# Patient Record
Sex: Male | Born: 2000 | Race: Asian | Hispanic: No | Marital: Single | Smoking: Never smoker
Health system: Southern US, Community
[De-identification: ages and names within clinical notes are randomized; demographics above are authoritative.]

## PROBLEM LIST (undated history)

## (undated) DIAGNOSIS — T7840XA Allergy, unspecified, initial encounter: Secondary | ICD-10-CM

## (undated) DIAGNOSIS — J020 Streptococcal pharyngitis: Secondary | ICD-10-CM

## (undated) HISTORY — PX: NO PAST SURGERIES: SHX2092

## (undated) HISTORY — DX: Allergy, unspecified, initial encounter: T78.40XA

---
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
## 2019-10-23 ENCOUNTER — Emergency Department (HOSPITAL_COMMUNITY)
Admission: EM | Admit: 2019-10-23 | Discharge: 2019-10-23 | Disposition: A | Discharge status: Home / Self Care | Payer: BC Managed Care – PPO | Attending: Emergency Medicine | Admitting: Emergency Medicine

## 2019-10-23 ENCOUNTER — Other Ambulatory Visit: Payer: Self-pay

## 2019-10-23 ENCOUNTER — Encounter (HOSPITAL_COMMUNITY): Payer: Self-pay | Admitting: Emergency Medicine

## 2019-10-23 DIAGNOSIS — X58XXXA Exposure to other specified factors, initial encounter: Secondary | ICD-10-CM | POA: Insufficient documentation

## 2019-10-23 DIAGNOSIS — Y939 Activity, unspecified: Secondary | ICD-10-CM | POA: Diagnosis not present

## 2019-10-23 DIAGNOSIS — J302 Other seasonal allergic rhinitis: Secondary | ICD-10-CM | POA: Diagnosis not present

## 2019-10-23 DIAGNOSIS — H9202 Otalgia, left ear: Secondary | ICD-10-CM | POA: Diagnosis present

## 2019-10-23 DIAGNOSIS — Y929 Unspecified place or not applicable: Secondary | ICD-10-CM | POA: Diagnosis not present

## 2019-10-23 DIAGNOSIS — Y999 Unspecified external cause status: Secondary | ICD-10-CM | POA: Diagnosis not present

## 2019-10-23 DIAGNOSIS — S00412A Abrasion of left ear, initial encounter: Secondary | ICD-10-CM | POA: Insufficient documentation

## 2019-10-23 MED ORDER — FLUTICASONE PROPIONATE 50 MCG/ACT NA SUSP: 2.0000 | Freq: Every day | NASAL | 0 refills | Status: DC

## 2019-10-23 MED ORDER — CETIRIZINE HCL 10 MG PO TABS: 10.0000 mg | ORAL_TABLET | Freq: Every day | ORAL | 0 refills | Status: DC

## 2019-10-23 MED ORDER — NEOMYCIN-POLYMYXIN-HC 3.5-10000-1 OT SUSP: 4.0000 [drp] | Freq: Three times a day (TID) | OTIC | 0 refills | Status: AC

## 2019-10-23 NOTE — ED Notes (Signed)
Pt discharged from ED; instructions provided and scripts given; Pt encouraged to return to ED if symptoms worsen and to f/u with PCP; Pt verbalized understanding of all instructions 

## 2019-10-23 NOTE — Discharge Instructions (Addendum)
1. Medications: Zyrtec, Flonase, polymyxin, usual home medications 2. Treatment: rest, drink plenty of fluids, do not stick sharp things in your ears 3. Follow Up: Please followup with your primary doctor in 3 days for discussion of your diagnoses and further evaluation after today's visit; if you do not have a primary care doctor use the resource guide provided to find one; Please return to the ER for fever, chills, changes in hearing, changes in vision or other concerns.

## 2019-10-23 NOTE — ED Provider Notes (Addendum)
[facility] EMERGENCY DEPARTMENT Provider Note   CSN: [id number] Arrival date & time: 10/23/19  2115     History   Chief Complaint Chief Complaint  Patient presents with  . Ear Fullness    HPI Cody Roy is a 18 y.o. male with a hx of seasonal allergies presents to the Emergency Department complaining of gradual, persistent, progressively worsening fullness in his right ear onset 2 days ago with fullness in the left ear starting 1 day ago.  Pt reports associated nasal congestion and rhinorrhea.  Pt reports hx of same, but is not currently taking any medication.  No treatments PTA.  Pt denies fever, chills, difficulty swallowing, difficulty breathing, cough.  Pt reports he thought his ears were full of wax and attempted to dig the wax out. Afterwards he noticed some bleeding from the left canal.      The history is provided by the patient and medical records. No language interpreter was used.    Past Medical History:  Diagnosis Date  . Allergy     There are no active problems to display for this patient.   History reviewed. No pertinent surgical history.      Home Medications    Prior to Admission medications   Medication Sig Start Date End Date Taking? Authorizing Provider  cetirizine (ZYRTEC ALLERGY) 10 MG tablet Take 1 tablet (10 mg total) by mouth daily. 10/23/19   Lissa Rowles, Dahlia Client, PA-C  fluticasone (FLONASE) 50 MCG/ACT nasal spray Place 2 sprays into both nostrils daily. 10/23/19   Asmara Backs, Dahlia Client, PA-C  neomycin-polymyxin-hydrocortisone (CORTISPORIN) 3.5-10000-1 OTIC suspension Place 4 drops into the left ear 3 (three) times daily for 3 days. 10/23/19 10/26/19  Simcha Speir, Dahlia Client, PA-C  penicillin v potassium (VEETID) 500 MG tablet Take 1 tablet (500 mg total) by mouth 3 (three) times daily. 08/30/14   Valarie Cones, Dema Severin, PA-C    Family History No family history on file.  Social History Social History   Tobacco Use  . Smoking status:  Never Smoker  Substance Use Topics  . Alcohol use: No  . Drug use: No     Allergies   Patient has no known allergies.   Review of Systems Review of Systems  Constitutional: Negative for fever.  HENT: Positive for congestion, ear discharge (left ear), postnasal drip and rhinorrhea. Negative for ear pain, facial swelling, nosebleeds, sore throat, tinnitus, trouble swallowing and voice change.        Ear fullness  Eyes: Negative for visual disturbance.  Respiratory: Negative for cough.   Skin: Negative for rash.     Physical Exam Updated Vital Signs BP (!) 135/99 (BP Location: Right Arm)   Pulse (!) 110   Temp 98.4 F (36.9 C) (Oral)   Resp 20   SpO2 98%   Physical Exam Vitals signs and nursing note reviewed.  Constitutional:      General: He is not in acute distress.    Appearance: He is well-developed.  HENT:     Head: Normocephalic.     Jaw: There is normal jaw occlusion. No trismus.     Right Ear: Hearing, ear canal and external ear normal. A middle ear effusion ( clear) is present. Tympanic membrane is not injected, perforated, erythematous, retracted or bulging.     Left Ear: Hearing and external ear normal. A middle ear effusion ( clear) is present. Tympanic membrane is not injected, perforated, erythematous, retracted or bulging.     Ears:     Comments: Abrasion  noted to the left canal with dried blood. No TM perforation.      Nose: Congestion and rhinorrhea present.     Mouth/Throat:     Mouth: Mucous membranes are moist.     Pharynx: Oropharynx is clear.  Eyes:     General: No scleral icterus.    Conjunctiva/sclera: Conjunctivae normal.  Neck:     Musculoskeletal: Normal range of motion.  Cardiovascular:     Rate and Rhythm: Normal rate.  Pulmonary:     Effort: Pulmonary effort is normal.  Musculoskeletal: Normal range of motion.  Skin:    General: Skin is warm and dry.  Neurological:     Mental Status: He is alert.      ED Treatments / Results    Procedures Procedures (including critical care time)  Medications Ordered in ED Medications - No data to display   Initial Impression / Assessment and Plan / ED Course  I have reviewed the triage vital signs and the nursing notes.  Pertinent labs & imaging results that were available during my care of the patient were reviewed by me and considered in my medical decision making (see chart for details).  Clinical Course as of Oct 22 2352  Sat Oct 23, 2019  2315 Pt with tachycardia at triage but no tachycardia on exam.   Pulse Rate: 68 [HM]    Clinical Course User Index [HM] Meaghen Vecchiarelli, Jarrett Soho, PA-C       Pt with small, clear bilateral ear effusion.  No evidence of otitis media.  No rupture of tympanic membrane.  Suspect allergic rhinitis causing clear ear effusion.  Will start patient on Zyrtec and give Flonase.  Left ear canal with bleeding abrasion from attempted cerumen removal.  Will give polymyxin drops for early otitis externa.  Discussed reasons to return immediately to the emergency department for further evaluation.  Final Clinical Impressions(s) / ED Diagnoses   Final diagnoses:  Seasonal allergic rhinitis, unspecified trigger  Abrasion of left ear canal, initial encounter    ED Discharge Orders         Ordered    cetirizine (ZYRTEC ALLERGY) 10 MG tablet  Daily     10/23/19 2318    fluticasone (FLONASE) 50 MCG/ACT nasal spray  Daily     10/23/19 2318    neomycin-polymyxin-hydrocortisone (CORTISPORIN) 3.5-10000-1 OTIC suspension  3 times daily     10/23/19 2318           Bitania Shankland, Gwenlyn Perking 10/23/19 2319    Keiandre Cygan, Jarrett Soho, PA-C 10/23/19 2354    Isla Pence, MD 10/24/19 1501

## 2019-10-23 NOTE — ED Triage Notes (Signed)
Pt here for eval of ear pain to both ears, right ear feels worse. Pt states he feels like there is water in his ears X 2 days. Some sneezing in the morning.

## 2019-11-06 ENCOUNTER — Other Ambulatory Visit: Payer: Self-pay

## 2019-11-06 ENCOUNTER — Encounter (HOSPITAL_COMMUNITY): Payer: Self-pay | Admitting: Emergency Medicine

## 2019-11-06 ENCOUNTER — Emergency Department (HOSPITAL_COMMUNITY)
Admission: EM | Admit: 2019-11-06 | Discharge: 2019-11-06 | Disposition: A | Discharge status: Home / Self Care | Payer: BC Managed Care – PPO | Attending: Emergency Medicine | Admitting: Emergency Medicine

## 2019-11-06 ENCOUNTER — Emergency Department (HOSPITAL_COMMUNITY): Payer: BC Managed Care – PPO

## 2019-11-06 DIAGNOSIS — Z79899 Other long term (current) drug therapy: Secondary | ICD-10-CM | POA: Insufficient documentation

## 2019-11-06 DIAGNOSIS — Y999 Unspecified external cause status: Secondary | ICD-10-CM | POA: Diagnosis not present

## 2019-11-06 DIAGNOSIS — M25511 Pain in right shoulder: Secondary | ICD-10-CM | POA: Diagnosis not present

## 2019-11-06 DIAGNOSIS — Y92410 Unspecified street and highway as the place of occurrence of the external cause: Secondary | ICD-10-CM | POA: Insufficient documentation

## 2019-11-06 DIAGNOSIS — Y9389 Activity, other specified: Secondary | ICD-10-CM | POA: Insufficient documentation

## 2019-11-06 MED ORDER — CYCLOBENZAPRINE HCL 10 MG PO TABS: 10.0000 mg | ORAL_TABLET | Freq: Two times a day (BID) | ORAL | 0 refills | Status: DC | PRN

## 2019-11-06 MED ORDER — IBUPROFEN 800 MG PO TABS: 800.0000 mg | ORAL_TABLET | Freq: Three times a day (TID) | ORAL | 0 refills | Status: DC

## 2019-11-06 MED ORDER — IBUPROFEN 400 MG PO TABS
800.0000 mg | ORAL_TABLET | Freq: Once | ORAL | Status: AC
  Administered 2019-11-06: 03:00:00 800 mg via ORAL
  Filled 2019-11-06: qty 2
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
## 2019-11-06 NOTE — ED Notes (Signed)
ED Provider at bedside. 
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
## 2020-09-19 ENCOUNTER — Emergency Department (HOSPITAL_COMMUNITY)
Admission: EM | Admit: 2020-09-19 | Discharge: 2020-09-20 | Disposition: A | Discharge status: Home / Self Care | Payer: BC Managed Care – PPO | Attending: Emergency Medicine | Admitting: Emergency Medicine

## 2020-09-19 ENCOUNTER — Emergency Department (HOSPITAL_COMMUNITY): Payer: BC Managed Care – PPO

## 2020-09-19 ENCOUNTER — Encounter (HOSPITAL_COMMUNITY): Payer: Self-pay | Admitting: Emergency Medicine

## 2020-09-19 ENCOUNTER — Other Ambulatory Visit: Payer: Self-pay

## 2020-09-19 DIAGNOSIS — J069 Acute upper respiratory infection, unspecified: Secondary | ICD-10-CM | POA: Insufficient documentation

## 2020-09-19 DIAGNOSIS — Z20822 Contact with and (suspected) exposure to covid-19: Secondary | ICD-10-CM | POA: Diagnosis not present

## 2020-09-19 DIAGNOSIS — R0981 Nasal congestion: Secondary | ICD-10-CM | POA: Diagnosis present

## 2020-09-19 LAB — CBC
HCT: 48.3 % (ref 39.0–52.0)
Hemoglobin: 15.7 g/dL (ref 13.0–17.0)
MCH: 26.7 pg (ref 26.0–34.0)
MCHC: 32.5 g/dL (ref 30.0–36.0)
MCV: 82.1 fL (ref 80.0–100.0)
Platelets: 282 10*3/uL (ref 150–400)
RBC: 5.88 MIL/uL — ABNORMAL HIGH (ref 4.22–5.81)
RDW: 12.6 % (ref 11.5–15.5)
WBC: 9.9 10*3/uL (ref 4.0–10.5)
nRBC: 0 % (ref 0.0–0.2)

## 2020-09-19 LAB — BASIC METABOLIC PANEL
Anion gap: 12 (ref 5–15)
BUN: 16 mg/dL (ref 6–20)
CO2: 22 mmol/L (ref 22–32)
Calcium: 9.5 mg/dL (ref 8.9–10.3)
Chloride: 103 mmol/L (ref 98–111)
Creatinine, Ser: 0.84 mg/dL (ref 0.61–1.24)
GFR calc non Af Amer: >60 mL/min (ref >60)
Glucose, Bld: 98 mg/dL (ref 70–99)
Potassium: 4.2 mmol/L (ref 3.5–5.1)
Sodium: 137 mmol/L (ref 135–145)

## 2020-09-19 LAB — RESPIRATORY PANEL BY RT PCR (FLU A&B, COVID)
Influenza A by PCR: NEGATIVE
Influenza B by PCR: NEGATIVE
SARS Coronavirus 2 by RT PCR: NEGATIVE

## 2020-09-19 MED ORDER — ALBUTEROL SULFATE (2.5 MG/3ML) 0.083% IN NEBU: 5.0000 mg | INHALATION_SOLUTION | Freq: Once | RESPIRATORY_TRACT | Status: DC

## 2020-09-19 NOTE — ED Triage Notes (Addendum)
Pt presents to ED POv. Pt c/o coughing, rhinorrhea, SOB, CP w/ resp. Pt reports he was exposed to COVID last week. Resp e/u. Pt is vaccinated

## 2020-09-20 MED ORDER — ALBUTEROL SULFATE HFA 108 (90 BASE) MCG/ACT IN AERS
2.0000 | INHALATION_SPRAY | Freq: Once | RESPIRATORY_TRACT | Status: AC
  Administered 2020-09-20: 2 via RESPIRATORY_TRACT
  Filled 2020-09-20: qty 6.7

## 2020-09-20 NOTE — ED Provider Notes (Signed)
[facility] EMERGENCY DEPARTMENT Provider Note   CSN: [id number] Arrival date & time: 09/19/20  2142     History Chief Complaint  Patient presents with  . Covid Exposure    Cody Roy is a 19 y.o. male.   19 year old male with hx of seasonal allergies presents to the emergency department for evaluation of URI symptoms.  Reports that he began with congestion and rhinorrhea 2 days ago.  Took Allegra without relief.  Has since developed a cough productive of phlegm.  Has some discomfort with breathing.  Reports subjective fever, but is unsure of his temperature at home.  Was exposed to COVID-19 last week via a coworker.  States that he is fully vaccinated against COVID-19.  Denies V/D, hemoptysis, syncope, leg swelling, recent surgeries or hospitalizations.  The history is provided by the patient. No language interpreter was used.       Past Medical History:  Diagnosis Date  . Allergy     There are no problems to display for this patient.   History reviewed. No pertinent surgical history.     History reviewed. No pertinent family history.  Social History   Tobacco Use  . Smoking status: Never Smoker  . Smokeless tobacco: Never Used  Substance Use Topics  . Alcohol use: No  . Drug use: No    Home Medications Prior to Admission medications   Medication Sig Start Date End Date Taking? Authorizing Provider  cetirizine (ZYRTEC ALLERGY) 10 MG tablet Take 1 tablet (10 mg total) by mouth daily. 10/23/19   Muthersbaugh, Dahlia Client, PA-C  cyclobenzaprine (FLEXERIL) 10 MG tablet Take 1 tablet (10 mg total) by mouth 2 (two) times daily as needed for muscle spasms. 11/06/19   Roxy Horseman, PA-C  fluticasone (FLONASE) 50 MCG/ACT nasal spray Place 2 sprays into both nostrils daily. Patient not taking: Reported on 09/20/2020 10/23/19   Muthersbaugh, Dahlia Client, PA-C  ibuprofen (ADVIL) 800 MG tablet Take 1 tablet (800 mg total) by mouth 3 (three) times daily. Patient  not taking: Reported on 09/20/2020 11/06/19   Roxy Horseman, PA-C  penicillin v potassium (VEETID) 500 MG tablet Take 1 tablet (500 mg total) by mouth 3 (three) times daily. Patient not taking: Reported on 09/20/2020 08/30/14   Morrell Riddle, PA-C    Allergies    Patient has no known allergies.  Review of Systems   Review of Systems  Ten systems reviewed and are negative for acute change, except as noted in the HPI.    Physical Exam Updated Vital Signs BP 123/87   Pulse (!) 59   Temp 98.8 F (37.1 C) (Oral)   Resp 18   SpO2 99%   Physical Exam Vitals and nursing note reviewed.  Constitutional:      General: He is not in acute distress.    Appearance: He is well-developed. He is not diaphoretic.     Comments: Nontoxic appearing and in NAD  HENT:     Head: Normocephalic and atraumatic.     Nose: Congestion present. No rhinorrhea.  Eyes:     General: No scleral icterus.    Conjunctiva/sclera: Conjunctivae normal.  Cardiovascular:     Rate and Rhythm: Normal rate and regular rhythm.     Pulses: Normal pulses.  Pulmonary:     Effort: Pulmonary effort is normal. No respiratory distress.     Breath sounds: No stridor. No wheezing or rales.     Comments: Respirations even and unlabored Musculoskeletal:  General: Normal range of motion.     Cervical back: Normal range of motion.  Skin:    General: Skin is warm and dry.     Coloration: Skin is not pale.     Findings: No erythema or rash.  Neurological:     Mental Status: He is alert and oriented to person, place, and time.     Coordination: Coordination normal.  Psychiatric:        Behavior: Behavior normal.     ED Results / Procedures / Treatments   Labs (all labs ordered are listed, but only abnormal results are displayed) Labs Reviewed  CBC - Abnormal; Notable for the following components:      Result Value   RBC 5.88 (*)    All other components within normal limits  RESPIRATORY PANEL BY RT PCR (FLU  A&B, COVID)  BASIC METABOLIC PANEL    EKG EKG Interpretation  Date/Time:  Tuesday September 19 2020 22:11:48 EDT Ventricular Rate:  101 PR Interval:  134 QRS Duration: 94 QT Interval:  344 QTC Calculation: 446 R Axis:   113 Text Interpretation: Sinus tachycardia Right axis deviation Cannot rule out Inferior infarct , age undetermined Abnormal ECG No previous tracing Confirmed by Gilda Crease [phone]) on 09/20/2020 5:48:34 AM   Radiology DG Chest Portable 1 View  Result Date: 09/19/2020 CLINICAL DATA:  Pt presents to ED POv. Pt c/o coughing, rhinorrhea, SOB, CP w/ resp. Pt reports he was exposed to COVID last week. Resp e/u. Pt is vaccinated EXAM: PORTABLE CHEST 1 VIEW COMPARISON:  Chest x-ray 12/08/2012 FINDINGS: The heart size and mediastinal contours are within normal limits. No definite focal consolidation. No pulmonary edema. No pleural effusion. No pneumothorax. No acute osseous abnormality. IMPRESSION: No active disease. Electronically Signed   By: Tish Frederickson M.D.   On: 09/19/2020 22:31    Procedures Procedures (including critical care time)  Medications Ordered in ED Medications  albuterol (VENTOLIN HFA) 108 (90 Base) MCG/ACT inhaler 2 puff (has no administration in time range)    ED Course  I have reviewed the triage vital signs and the nursing notes.  Pertinent labs & imaging results that were available during my care of the patient were reviewed by me and considered in my medical decision making (see chart for details).    MDM Rules/Calculators/A&P                          CXR negative for acute infiltrate. Patient's symptoms are consistent with URI, likely viral etiology. Discussed that antibiotics are not indicated for viral infections. Patient will be discharged with symptomatic treatment. He verbalizes understanding and is agreeable with plan. Patient is hemodynamically stable and in NAD prior to discharge.  Cody Roy was evaluated in Emergency  Department on 09/20/2020 for the symptoms described in the history of present illness. He was evaluated in the context of the global COVID-19 pandemic, which necessitated consideration that the patient might be at risk for infection with the SARS-CoV-2 virus that causes COVID-19. Institutional protocols and algorithms that pertain to the evaluation of patients at risk for COVID-19 are in a state of rapid change based on information released by regulatory bodies including the CDC and federal and state organizations. These policies and algorithms were followed during the patient's care in the ED.   Final Clinical Impression(s) / ED Diagnoses Final diagnoses:  Upper respiratory tract infection, unspecified type    Rx / DC Orders ED  Discharge Orders    None       Antony Madura, PA-C 09/20/20 [id number]    Gilda Crease, MD 09/22/20 [phone]

## 2020-09-20 NOTE — Discharge Instructions (Signed)
Your Covid test today was negative.  You are likely suffering from a different viral URI.  We recommend daily use of an allergy medication such as Zyrtec, Claritin, Allegra for management of congestion.  You may also use over-the-counter nasal sprays for relief.  Take Mucinex for cough and 1000 mg Tylenol every 8 hours for headache, body aches, fever.  You have been given an albuterol inhaler and may use 2 puffs every 4-6 hours for wheezing, chest tightness, shortness of breath.  Follow-up with your primary care doctor in 1 week for persistent symptoms.

## 2020-10-10 ENCOUNTER — Ambulatory Visit
Admission: EM | Admit: 2020-10-10 | Discharge: 2020-10-10 | Disposition: A | Discharge status: Home / Self Care | Payer: BC Managed Care – PPO

## 2020-10-10 DIAGNOSIS — H6592 Unspecified nonsuppurative otitis media, left ear: Secondary | ICD-10-CM

## 2020-10-10 MED ORDER — CETIRIZINE HCL 10 MG PO TABS: 10.0000 mg | ORAL_TABLET | Freq: Every day | ORAL | 0 refills | Status: DC

## 2020-10-10 MED ORDER — FLUTICASONE PROPIONATE 50 MCG/ACT NA SUSP: 1.0000 | Freq: Every day | NASAL | 2 refills | Status: AC

## 2020-10-10 NOTE — ED Triage Notes (Signed)
Pt c/o lt ear fullness, sharp pain, and warm to touch x3 days.

## 2020-10-10 NOTE — Discharge Instructions (Addendum)
Take Zyrtec and Flonase for middle ear effusion.

## 2020-10-10 NOTE — ED Provider Notes (Signed)
Emergency Department Provider Note  ____________________________________________  Time seen: Approximately 10:41 AM  I have reviewed the triage vital signs and the nursing notes.   HISTORY  Chief Complaint Otalgia   Historian Patient     HPI Cody Roy is a 19 y.o. male presents to the emergency department with aching left ear pain for the past 3 days. Patient denies discharge from the left ear or difficulty hearing. No recent episodes of swimming. Patient states he is not prone to otitis media and has not experienced similar symptoms recently. Does have a history of seasonal allergies. No fever or chills at home. No other alleviating measures have been attempted.   Past Medical History:  Diagnosis Date  . Allergy      Immunizations up to date:  Yes.     Past Medical History:  Diagnosis Date  . Allergy     There are no problems to display for this patient.   History reviewed. No pertinent surgical history.  Prior to Admission medications   Medication Sig Start Date End Date Taking? Authorizing Provider  fexofenadine (ALLEGRA) 60 MG tablet Take 60 mg by mouth daily.  10/10/20 Yes [provider]  cetirizine (ZYRTEC ALLERGY) 10 MG tablet Take 1 tablet (10 mg total) by mouth daily for 10 days. 10/10/20 10/20/20  Orvil Feil, PA-C  fluticasone (FLONASE) 50 MCG/ACT nasal spray Place 1 spray into both nostrils daily for 7 days. 10/10/20 10/17/20  Orvil Feil, PA-C    Allergies Patient has no known allergies.  No family history on file.  Social History Social History   Tobacco Use  . Smoking status: Never Smoker  . Smokeless tobacco: Never Used  Substance Use Topics  . Alcohol use: No  . Drug use: No     Review of Systems  Constitutional: No fever/chills Eyes:  No discharge ENT: Patient has left ear pain.  Respiratory: no cough. No SOB/ use of accessory muscles to breath Gastrointestinal:   No nausea, no vomiting.  No diarrhea.  No  constipation. Musculoskeletal: Negative for musculoskeletal pain. Skin: Negative for rash, abrasions, lacerations, ecchymosis.    ____________________________________________   PHYSICAL EXAM:  VITAL SIGNS: ED Triage Vitals  Enc Vitals Group     BP 10/10/20 1026 132/85     Pulse Rate 10/10/20 1026 70     Resp 10/10/20 1026 18     Temp 10/10/20 1026 (!) 97.5 F (36.4 C)     Temp Source 10/10/20 1026 Oral     SpO2 10/10/20 1026 97 %     Weight --      Height --      Head Circumference --      Peak Flow --      Pain Score 10/10/20 1027 8     Pain Loc --      Pain Edu? --      Excl. in GC? --      Constitutional: Alert and oriented. Well appearing and in no acute distress. Eyes: Conjunctivae are normal. PERRL. EOMI. Head: Atraumatic. ENT:      Ears: Left middle ear is effused. No evidence of purulent exudate behind left TM. No tenderness to palpation of the left tragus.      Nose: No congestion/rhinnorhea.      Mouth/Throat: Mucous membranes are moist.  Neck: No stridor.  No cervical spine tenderness to palpation. Cardiovascular: Normal rate, regular rhythm. Normal S1 and S2.  Good peripheral circulation. Respiratory: Normal respiratory effort without tachypnea or  retractions. Lungs CTAB. Good air entry to the bases with no decreased or absent breath sounds Skin:  Skin is warm, dry and intact. No rash noted. Psychiatric: Mood and affect are normal for age. Speech and behavior are normal.   ____________________________________________   LABS (all labs ordered are listed, but only abnormal results are displayed)  Labs Reviewed - No data to display ____________________________________________  EKG   ____________________________________________  RADIOLOGY  No results found.  ____________________________________________    PROCEDURES  Procedure(s) performed:     Procedures     Medications - No data to  display   ____________________________________________   INITIAL IMPRESSION / ASSESSMENT AND PLAN / ED COURSE  Pertinent labs & imaging results that were available during my care of the patient were reviewed by me and considered in my medical decision making (see chart for details).    Assessment and plan Middle ear effusion 19 year old male presents to the emergency department with left ear pain for the past 3 days. Physical exam findings suggest middle ear effusion. Recommended Flonase and Zyrtec. Return precautions were given to return with new or worsening symptoms.    ____________________________________________  FINAL CLINICAL IMPRESSION(S) / ED DIAGNOSES  Final diagnoses:  Fluid level behind tympanic membrane of left ear      NEW MEDICATIONS STARTED DURING THIS VISIT:  ED Discharge Orders         Ordered    fluticasone (FLONASE) 50 MCG/ACT nasal spray  Daily        10/10/20 1039    cetirizine (ZYRTEC ALLERGY) 10 MG tablet  Daily        10/10/20 1039              This chart was dictated using voice recognition software/Dragon. Despite best efforts to proofread, errors can occur which can change the meaning. Any change was purely unintentional.     Orvil Feil, PA-C 10/10/20 1043

## 2021-01-28 ENCOUNTER — Emergency Department (HOSPITAL_COMMUNITY): Payer: BC Managed Care – PPO

## 2021-01-28 ENCOUNTER — Emergency Department (HOSPITAL_COMMUNITY)
Admission: EM | Admit: 2021-01-28 | Discharge: 2021-01-28 | Disposition: A | Discharge status: Home / Self Care | Payer: BC Managed Care – PPO | Attending: Emergency Medicine | Admitting: Emergency Medicine

## 2021-01-28 ENCOUNTER — Other Ambulatory Visit: Payer: Self-pay

## 2021-01-28 ENCOUNTER — Encounter (HOSPITAL_COMMUNITY): Payer: Self-pay | Admitting: *Deleted

## 2021-01-28 DIAGNOSIS — R1110 Vomiting, unspecified: Secondary | ICD-10-CM | POA: Diagnosis not present

## 2021-01-28 DIAGNOSIS — R0602 Shortness of breath: Secondary | ICD-10-CM | POA: Diagnosis present

## 2021-01-28 DIAGNOSIS — R059 Cough, unspecified: Secondary | ICD-10-CM | POA: Insufficient documentation

## 2021-01-28 MED ORDER — ALBUTEROL SULFATE HFA 108 (90 BASE) MCG/ACT IN AERS
2.0000 | INHALATION_SPRAY | RESPIRATORY_TRACT | Status: DC | PRN
  Administered 2021-01-28: 2 via RESPIRATORY_TRACT
  Filled 2021-01-28: qty 6.7

## 2021-01-28 NOTE — ED Triage Notes (Signed)
Patient presents to ed c/o cough x 1 week, states this am he woke up coughing until he threw up, c/o chest pain with cough, now he feels like he is wheezing.

## 2021-01-28 NOTE — ED Provider Notes (Signed)
[facility] Emergency Department Provider Note MRN:  [id number]  Arrival date & time: 01/28/21     Chief Complaint   Shortness of Breath   History of Present Illness   Cody Roy is a 20 y.o. year-old male with no previous medical history presenting to the ED with chief complaint of shortness of breath.  Patient endorsing severe coughing fit this evening, persistently coughing, unable to catch breath, one episode of posttussive emesis.  Feeling completely better after inhaler treatment in triage, no coughing since.  Denies any shortness of breath, no chest pain, no leg pain or swelling, no other complaints.  Review of Systems  A complete 10 system review of systems was obtained and all systems are negative except as noted in the HPI and PMH.   Patient's Health History    Past Medical History:  Diagnosis Date  . Allergy     History reviewed. No pertinent surgical history.  No family history on file.  Social History   Socioeconomic History  . Marital status: Single    Spouse name: Not on file  . Number of children: Not on file  . Years of education: Not on file  . Highest education level: Not on file  Occupational History  . Not on file  Tobacco Use  . Smoking status: Never Smoker  . Smokeless tobacco: Never Used  Substance and Sexual Activity  . Alcohol use: No  . Drug use: No  . Sexual activity: Not on file  Other Topics Concern  . Not on file  Social History Narrative  . Not on file   Social Determinants of Health   Financial Resource Strain: Not on file  Food Insecurity: Not on file  Transportation Needs: Not on file  Physical Activity: Not on file  Stress: Not on file  Social Connections: Not on file  Intimate Partner Violence: Not on file     Physical Exam   Vitals:   01/28/21 0313  BP: (!) 135/108  Pulse: 91  Resp: 17  Temp: 98 F (36.7 C)  SpO2: 100%    CONSTITUTIONAL: Well-appearing, NAD NEURO:  Alert and  oriented x 3, no focal deficits EYES:  eyes equal and reactive ENT/NECK:  no LAD, no JVD CARDIO: Regular rate, well-perfused, normal S1 and S2 PULM:  CTAB no wheezing or rhonchi GI/GU:  normal bowel sounds, non-distended, non-tender MSK/SPINE:  No gross deformities, no edema SKIN:  no rash, atraumatic PSYCH:  Appropriate speech and behavior  *Additional and/or pertinent findings included in MDM below  Diagnostic and Interventional Summary    EKG Interpretation  Date/Time:    Ventricular Rate:    PR Interval:    QRS Duration:   QT Interval:    QTC Calculation:   R Axis:     Text Interpretation:        Labs Reviewed - No data to display  DG Chest 2 View  Final Result      Medications  albuterol (VENTOLIN HFA) 108 (90 Base) MCG/ACT inhaler 2 puff (2 puffs Inhalation Given 01/28/21 0328)     Procedures  /  Critical Care Procedures  ED Course and Medical Decision Making  I have reviewed the triage vital signs, the nursing notes, and pertinent available records from the EMR.  Listed above are laboratory and imaging tests that I personally ordered, reviewed, and interpreted and then considered in my medical decision making (see below for details).  Cough, suspect viral process, x-ray is normal.  No  leg pain or swelling, no shortness of breath, doubt PE, appropriate for discharge.       Elmer Sow. Pilar Plate, MD [facility] Emergency Medicine [facility] [email or web address]  Final Clinical Impressions(s) / ED Diagnoses     ICD-10-CM   1. Cough  R05.9     ED Discharge Orders    None       Discharge Instructions Discussed with and Provided to Patient:     Discharge Instructions     You were evaluated in the Emergency Department and after careful evaluation, we did not find any emergent condition requiring admission or further testing in the hospital.  Your exam/testing today was overall reassuring.  Use the inhaler as needed for coughing  fits.  Please return to the Emergency Department if you experience any worsening of your condition.  Thank you for allowing [place] to be a part of your care.       Sabas Sous, MD 01/28/21 [phone]

## 2021-01-28 NOTE — Discharge Instructions (Addendum)
You were evaluated in the Emergency Department and after careful evaluation, we did not find any emergent condition requiring admission or further testing in the hospital.  Your exam/testing today was overall reassuring.  Use the inhaler as needed for coughing fits.  Please return to the Emergency Department if you experience any worsening of your condition.  Thank you for allowing [place] to be a part of your care.

## 2021-02-26 ENCOUNTER — Encounter: Payer: Self-pay | Admitting: Emergency Medicine

## 2021-02-26 ENCOUNTER — Ambulatory Visit
Admission: EM | Admit: 2021-02-26 | Discharge: 2021-02-26 | Disposition: A | Discharge status: Home / Self Care | Payer: BC Managed Care – PPO

## 2021-02-26 ENCOUNTER — Other Ambulatory Visit: Payer: Self-pay

## 2021-02-26 DIAGNOSIS — J22 Unspecified acute lower respiratory infection: Secondary | ICD-10-CM | POA: Diagnosis not present

## 2021-02-26 MED ORDER — DOXYCYCLINE HYCLATE 100 MG PO CAPS: 100.0000 mg | ORAL_CAPSULE | Freq: Two times a day (BID) | ORAL | 0 refills | Status: AC

## 2021-02-26 MED ORDER — CETIRIZINE HCL 10 MG PO CAPS: 10.0000 mg | ORAL_CAPSULE | Freq: Every day | ORAL | 0 refills | Status: AC

## 2021-02-26 MED ORDER — BENZONATATE 100 MG PO CAPS: 100.0000 mg | ORAL_CAPSULE | Freq: Three times a day (TID) | ORAL | 0 refills | Status: AC

## 2021-02-26 NOTE — ED Provider Notes (Signed)
[facility]    CSN: [id number] Arrival date & time: 02/26/21  0951      History   Chief Complaint Chief Complaint  Patient presents with  . Cough    HPI Cody Roy is a 20 y.o. male presenting today for evaluation of a cough.  Reports that he has had a cough for approximately 1 month.  Seen in date ED 2 weeks ago and given an inhaler.  Chest x-ray negative.  Reports continued cough, occasional wheezing, postnasal drainage and throat irritation.  Denies any fevers body aches chills or fatigue  HPI  Past Medical History:  Diagnosis Date  . Allergy     There are no problems to display for this patient.   History reviewed. No pertinent surgical history.     Home Medications    Prior to Admission medications   Medication Sig Start Date End Date Taking? Authorizing Provider  ALBUTEROL IN Inhale into the lungs.   Yes [provider]  benzonatate (TESSALON) 100 MG capsule Take 1 capsule (100 mg total) by mouth every 8 (eight) hours. 02/26/21  Yes Zaide Mcclenahan C, PA-C  Cetirizine HCl 10 MG CAPS Take 1 capsule (10 mg total) by mouth daily for 10 days. 02/26/21 03/08/21 Yes Tyshell Ramberg C, PA-C  doxycycline (VIBRAMYCIN) 100 MG capsule Take 1 capsule (100 mg total) by mouth 2 (two) times daily for 10 days. 02/26/21 03/08/21 Yes Kellee Sittner C, PA-C  fexofenadine (ALLEGRA) 60 MG tablet Take 60 mg by mouth daily.  10/10/20  [provider]  fluticasone (FLONASE) 50 MCG/ACT nasal spray Place 1 spray into both nostrils daily for 7 days. 10/10/20 10/17/20  Orvil Feil, PA-C    Family History History reviewed. No pertinent family history.  Social History Social History   Tobacco Use  . Smoking status: Never Smoker  . Smokeless tobacco: Never Used  Substance Use Topics  . Alcohol use: No  . Drug use: No     Allergies   Patient has no known allergies.   Review of Systems Review of Systems  Constitutional: Negative for activity  change, appetite change, chills, fatigue and fever.  HENT: Positive for congestion, rhinorrhea, sinus pressure and sore throat. Negative for ear pain and trouble swallowing.   Eyes: Negative for discharge and redness.  Respiratory: Positive for cough. Negative for chest tightness and shortness of breath.   Cardiovascular: Negative for chest pain.  Gastrointestinal: Negative for abdominal pain, diarrhea, nausea and vomiting.  Musculoskeletal: Negative for myalgias.  Skin: Negative for rash.  Neurological: Negative for dizziness, light-headedness and headaches.     Physical Exam Triage Vital Signs ED Triage Vitals [02/26/21 1041]  Enc Vitals Group     BP      Pulse      Resp      Temp      Temp src      SpO2      Weight      Height      Head Circumference      Peak Flow      Pain Score 9     Pain Loc      Pain Edu?      Excl. in GC?    No data found.  Updated Vital Signs BP (!) 141/83 (BP Location: Left Arm)   Pulse 62   Temp 98.2 F (36.8 C) (Oral)   Resp 20   SpO2 98%   Visual Acuity Right Eye Distance:   Left  Eye Distance:   Bilateral Distance:    Right Eye Near:   Left Eye Near:    Bilateral Near:     Physical Exam Vitals and nursing note reviewed.  Constitutional:      Appearance: He is well-developed.     Comments: No acute distress  HENT:     Head: Normocephalic and atraumatic.     Ears:     Comments: Bilateral ears without tenderness to palpation of external auricle, tragus and mastoid, EAC's without erythema or swelling, TM's with good bony landmarks and cone of light. Non erythematous.     Nose: Nose normal.     Mouth/Throat:     Comments: Oral mucosa pink and moist, no tonsillar enlargement or exudate. Posterior pharynx patent and nonerythematous, no uvula deviation or swelling. Normal phonation. Eyes:     Conjunctiva/sclera: Conjunctivae normal.  Cardiovascular:     Rate and Rhythm: Normal rate.  Pulmonary:     Effort: Pulmonary effort is  normal. No respiratory distress.     Comments: Breathing comfortably at rest, CTABL, no wheezing, rales or other adventitious sounds auscultated Abdominal:     General: There is no distension.  Musculoskeletal:        General: Normal range of motion.     Cervical back: Neck supple.  Skin:    General: Skin is warm and dry.  Neurological:     Mental Status: He is alert and oriented to person, place, and time.      UC Treatments / Results  Labs (all labs ordered are listed, but only abnormal results are displayed) Labs Reviewed - No data to display  EKG   Radiology No results found.  Procedures Procedures (including critical care time)  Medications Ordered in UC Medications - No data to display  Initial Impression / Assessment and Plan / UC Course  I have reviewed the triage vital signs and the nursing notes.  Pertinent labs & imaging results that were available during my care of the patient were reviewed by me and considered in my medical decision making (see chart for details).    URI with cough symptoms x1 month, covering for bacterial causes with doxycycline, Tessalon for cough, continue inhaler as needed, recommended daily antihistamine to help with postnasal drainage/throat irritation.  Discussed strict return precautions. Patient verbalized understanding and is agreeable with plan.  Final Clinical Impressions(s) / UC Diagnoses   Final diagnoses:  Lower respiratory infection (e.g., bronchitis, pneumonia, pneumonitis, pulmonitis)     Discharge Instructions     Doxycyline twice daily for 1 week Daily cetirizine/zyrtec  Tessalon for cough May use over the counter mucinex Follow up if not improving or worsening    ED Prescriptions    Medication Sig Dispense Auth. Provider   doxycycline (VIBRAMYCIN) 100 MG capsule Take 1 capsule (100 mg total) by mouth 2 (two) times daily for 10 days. 20 capsule Cederic Mozley C, PA-C   Cetirizine HCl 10 MG CAPS Take 1  capsule (10 mg total) by mouth daily for 10 days. 10 capsule Lashonna Rieke C, PA-C   benzonatate (TESSALON) 100 MG capsule Take 1 capsule (100 mg total) by mouth every 8 (eight) hours. 21 capsule Zyaire Mccleod, [place] C, PA-C     PDMP not reviewed this encounter.   Lew Dawes, [place] 02/26/21 1117

## 2021-02-26 NOTE — Discharge Instructions (Signed)
Doxycyline twice daily for 1 week Daily cetirizine/zyrtec  Tessalon for cough May use over the counter mucinex Follow up if not improving or worsening

## 2021-02-26 NOTE — ED Triage Notes (Signed)
Cough and phlegm in throat for a month.  Reports white phlegm produced.  Patient has coughing episodes.  Patient was seen in ED 2 weeks ago and received an inhaler, but reports this is not helping.  Patient thinks this is a bad sinus infection.  Throat is sore from coughing

## 2021-05-06 ENCOUNTER — Encounter (HOSPITAL_COMMUNITY): Payer: Self-pay

## 2021-05-06 ENCOUNTER — Ambulatory Visit (HOSPITAL_COMMUNITY)
Admission: EM | Admit: 2021-05-06 | Discharge: 2021-05-06 | Disposition: A | Discharge status: Home / Self Care | Payer: BC Managed Care – PPO | Attending: Sports Medicine | Admitting: Sports Medicine

## 2021-05-06 ENCOUNTER — Other Ambulatory Visit: Payer: Self-pay

## 2021-05-06 DIAGNOSIS — H9202 Otalgia, left ear: Secondary | ICD-10-CM

## 2021-05-06 DIAGNOSIS — H6123 Impacted cerumen, bilateral: Secondary | ICD-10-CM

## 2021-05-06 NOTE — Discharge Instructions (Addendum)
As we discussed, you have a lot of earwax buildup.  It is possible when you are in the pool pressure from the water put some of it back and is giving that sensation that you have some fullness in your ear.  I do believe you have an infection.  Please go to the drugstore and buy over-the-counter earwax removal kit.  You can ask for the pharmacy's help. Please see educational handouts.

## 2021-05-06 NOTE — ED Triage Notes (Signed)
Pt reports left ear pain and clear drainage x 2 days after being at the pool. Denies fever, chills.

## 2021-05-06 NOTE — ED Provider Notes (Signed)
[facility]    CSN: [id number] Arrival date & time: 05/06/21  1420      History   Chief Complaint Chief Complaint  Patient presents with  . Otalgia    HPI Cody Roy is a 20 y.o. male.   20 year old male who presents for evaluation of some discomfort in his left ear.  He does not have a primary care physician.  He works at the [employer].  He says he was [place] beach [place] and started to feel some discomfort in his left ear.  He thinks that he may have gotten water in there.  He denies any fever shakes chills.  No neck pain.  No sore throat.  No URI symptoms.  No COVID exposure or COVID symptoms.  No chest pain shortness of breath.  No red flag signs or symptoms elicited on history.      Past Medical History:  Diagnosis Date  . Allergy     There are no problems to display for this patient.   History reviewed. No pertinent surgical history.     Home Medications    Prior to Admission medications   Medication Sig Start Date End Date Taking? Authorizing Provider  cetirizine (ZYRTEC) 10 MG tablet Take 10 mg by mouth daily.   Yes [provider]  fexofenadine (ALLEGRA) 60 MG tablet Take 60 mg by mouth daily.  10/10/20  [provider]  ALBUTEROL IN Inhale into the lungs.    [provider]  benzonatate (TESSALON) 100 MG capsule Take 1 capsule (100 mg total) by mouth every 8 (eight) hours. 02/26/21   Wieters, Hallie C, PA-C  Cetirizine HCl 10 MG CAPS Take 1 capsule (10 mg total) by mouth daily for 10 days. 02/26/21 03/08/21  Wieters, Hallie C, PA-C  fluticasone (FLONASE) 50 MCG/ACT nasal spray Place 1 spray into both nostrils daily for 7 days. 10/10/20 10/17/20  Lannie Fields, PA-C    Family History History reviewed. No pertinent family history.  Social History Social History   Tobacco Use  . Smoking status: Never Smoker  . Smokeless tobacco: Never Used  Substance Use Topics  .  Alcohol use: No  . Drug use: No     Allergies   Patient has no known allergies.   Review of Systems Review of Systems  Constitutional: Negative for appetite change, chills, diaphoresis, fatigue and fever.  HENT: Positive for ear pain. Negative for congestion, ear discharge, postnasal drip, rhinorrhea, sinus pressure, sinus pain, sneezing and sore throat.   Eyes: Negative for pain.  Respiratory: Negative for cough, chest tightness and shortness of breath.   Cardiovascular: Negative for chest pain and palpitations.  Gastrointestinal: Negative for abdominal pain, diarrhea, nausea and vomiting.  Genitourinary: Negative for dysuria.  Musculoskeletal: Negative for back pain, myalgias and neck pain.  Skin: Negative for color change, pallor, rash and wound.  Neurological: Negative for dizziness, light-headedness and headaches.  All other systems reviewed and are negative.    Physical Exam Triage Vital Signs ED Triage Vitals  Enc Vitals Group     BP 05/06/21 1538 (!) 147/79     Pulse Rate 05/06/21 1538 97     Resp 05/06/21 1538 19     Temp 05/06/21 1538 98.4 F (36.9 C)     Temp Source 05/06/21 1538 Oral     SpO2 05/06/21 1538 99 %     Weight --      Height --  Head Circumference --      Peak Flow --      Pain Score 05/06/21 1535 4     Pain Loc --      Pain Edu? --      Excl. in [place]? --    No data found.  Updated Vital Signs BP (!) 147/79 (BP Location: Right Arm)   Pulse 97   Temp 98.4 F (36.9 C) (Oral)   Resp 19   SpO2 99%   Visual Acuity Right Eye Distance:   Left Eye Distance:   Bilateral Distance:    Right Eye Near:   Left Eye Near:    Bilateral Near:     Physical Exam Vitals and nursing note reviewed.  Constitutional:      General: He is not in acute distress.    Appearance: Normal appearance. He is not ill-appearing, toxic-appearing or diaphoretic.  HENT:     Head: Normocephalic and atraumatic.     Right Ear: There is impacted cerumen.     Left  Ear: There is impacted cerumen.     Nose: Nose normal. No congestion or rhinorrhea.     Mouth/Throat:     Mouth: Mucous membranes are moist.     Pharynx: No oropharyngeal exudate or posterior oropharyngeal erythema.  Eyes:     General: No scleral icterus.       Right eye: No discharge.        Left eye: No discharge.     Conjunctiva/sclera: Conjunctivae normal.     Pupils: Pupils are equal, round, and reactive to light.  Cardiovascular:     Rate and Rhythm: Normal rate and regular rhythm.     Pulses: Normal pulses.     Heart sounds: Normal heart sounds. No murmur heard. No friction rub. No gallop.   Pulmonary:     Effort: Pulmonary effort is normal. No respiratory distress.     Breath sounds: Normal breath sounds. No stridor. No wheezing, rhonchi or rales.  Musculoskeletal:     Cervical back: Normal range of motion and neck supple. No rigidity or tenderness.  Lymphadenopathy:     Cervical: No cervical adenopathy.  Skin:    General: Skin is warm and dry.     Capillary Refill: Capillary refill takes less than 2 seconds.  Neurological:     General: No focal deficit present.     Mental Status: He is alert and oriented to person, place, and time.  Psychiatric:        Mood and Affect: Mood normal.      UC Treatments / Results  Labs (all labs ordered are listed, but only abnormal results are displayed) Labs Reviewed - No data to display  EKG   Radiology No results found.  Procedures Procedures (including critical care time)  Medications Ordered in UC Medications - No data to display  Initial Impression / Assessment and Plan / UC Course  I have reviewed the triage vital signs and the nursing notes.  Pertinent labs & imaging results that were available during my care of the patient were reviewed by me and considered in my medical decision making (see chart for details).   Clinical impression: Left ear discomfort after swimming in a pool at the beach.  He does have  impacted cerumen bilaterally.  The remainder of exam is within normal limits.  I believe he did get some water in there but I do not think he has an active infection.  He has no changes in his  hearing.  Treatment plan: 1.  The findings and treatment plan were discussed in detail with the patient.  Patient was in agreement. 2.  I recommended Tylenol over-the-counter earwax removal kit to start with.  I also recommended mineral oil and cotton balls about an hour prior to bedtime to be placed in his ear should soften up the wax. 3.  Since he is asymptomatic other than the pressure I doubt that he does have an infection that could be treated.  If the earwax removal does not work he can come back here to have them removed manually by the nursing staff.  States he is never had this before I wanted to give him a try with over-the-counter opportunities to get the wax out of his ears. 4.  Educational handouts provided. 5.  Put in a referral for him to get a primary care physician. 6.  Plenty of rest, plenty fluids, Tylenol or Motrin for any discomfort. 7.  He was discharged in stable condition and he will follow-up here as needed.    Final Clinical Impressions(s) / UC Diagnoses   Final diagnoses:  Otalgia of left ear  Impacted cerumen of both ears     Discharge Instructions     As we discussed, you have a lot of earwax buildup.  It is possible when you are in the pool pressure from the water put some of it back and is giving that sensation that you have some fullness in your ear.  I do believe you have an infection.  Please go to the drugstore and buy over-the-counter earwax removal kit.  You can ask for the pharmacy's help. Please see educational handouts.    ED Prescriptions    None     PDMP not reviewed this encounter.   Verda Cumins, MD 05/06/21 [phone]

## 2021-05-11 ENCOUNTER — Telehealth (HOSPITAL_BASED_OUTPATIENT_CLINIC_OR_DEPARTMENT_OTHER): Payer: Self-pay

## 2021-05-11 NOTE — Telephone Encounter (Signed)
-----   Message from Aaron Edelman, RN sent at 05/11/2021  1:01 PM EDT ----- Regarding: UC to PCP Patient needs to establish with PCP - routine

## 2022-02-17 ENCOUNTER — Other Ambulatory Visit: Payer: Self-pay

## 2022-02-17 ENCOUNTER — Ambulatory Visit (HOSPITAL_COMMUNITY)
Admission: EM | Admit: 2022-02-17 | Discharge: 2022-02-17 | Disposition: A | Discharge status: Home / Self Care | Payer: BC Managed Care – PPO | Attending: Nurse Practitioner | Admitting: Nurse Practitioner

## 2022-02-17 ENCOUNTER — Encounter (HOSPITAL_COMMUNITY): Payer: Self-pay | Admitting: *Deleted

## 2022-02-17 DIAGNOSIS — R051 Acute cough: Secondary | ICD-10-CM

## 2022-02-17 DIAGNOSIS — J029 Acute pharyngitis, unspecified: Secondary | ICD-10-CM | POA: Diagnosis not present

## 2022-02-17 HISTORY — DX: Streptococcal pharyngitis: J02.0

## 2022-02-17 LAB — POCT RAPID STREP A, ED / UC: Streptococcus, Group A Screen (Direct): NEGATIVE

## 2022-02-17 MED ORDER — LIDOCAINE VISCOUS HCL 2 % MT SOLN: 5.0000 mL | Freq: Four times a day (QID) | OROMUCOSAL | 0 refills | Status: AC | PRN

## 2022-02-17 MED ORDER — PSEUDOEPH-BROMPHEN-DM 30-2-10 MG/5ML PO SYRP: 5.0000 mL | ORAL_SOLUTION | Freq: Four times a day (QID) | ORAL | 0 refills | Status: AC | PRN

## 2022-02-17 MED ORDER — IBUPROFEN 800 MG PO TABS: 800.0000 mg | ORAL_TABLET | Freq: Three times a day (TID) | ORAL | 0 refills | Status: AC | PRN

## 2022-02-17 NOTE — Discharge Instructions (Addendum)
Take medication as prescribed. ?You will be contacted if your throat culture is positive, medications will be prescribed if necessary. ?Also prescribed a medication to help with your cough and nasal congestion.  Take medication as prescribed. ?Warm salt water gargles 3-4 times daily to help with your throat pain. ?May drink warm or cool liquids, which ever is most soothing to your throat. ?I recommend a soft diet until your throat begins to feel better.  This includes soups, broths, or mashed potatoes for example. ?Follow-up if symptoms do not improve. ?

## 2022-02-17 NOTE — ED Provider Notes (Signed)
?[facility] ? ? ? ?CSN: [id number] ?Arrival date & time: 02/17/22  1109 ? ? ?  ? ?History   ?Chief Complaint ?Chief Complaint  ?Patient presents with  ? Sore Throat  ? ? ?HPI ?Cody Roy is a 21 y.o. male.  ? ?The patient is a 21 year old male who presents for sore throat pain for 2 days.  States he has a history of strep throat and his symptoms feel very similar.  He denies fever, chills, headache, ear pain, or GI symptoms.  He does have a cough that is productive at this time.  States he has been taking Mucinex and a throat spray which have not been helping his symptoms.  He does have a history of seasonal allergies and endorses some postnasal drainage as well.  He has with swallowing. ? ? ?Sore Throat ?The current episode started 2 days ago. The problem occurs constantly. The problem has been gradually worsening. Pertinent negatives include no abdominal pain, no headaches and no shortness of breath. Nothing aggravates the symptoms. Nothing relieves the symptoms.  ? ?Past Medical History:  ?Diagnosis Date  ? Allergy   ? Strep pharyngitis   ? ? ?There are no problems to display for this patient. ? ? ?Past Surgical History:  ?Procedure Laterality Date  ? NO PAST SURGERIES    ? ? ? ? ? ?Home Medications   ? ?Prior to Admission medications   ?Medication Sig Start Date End Date Taking? Authorizing Provider  ?guaiFENesin (MUCINEX PO) Take by mouth.   Yes [provider]  ?fexofenadine (ALLEGRA) 60 MG tablet Take 60 mg by mouth daily.  10/10/20  [provider]  ?ALBUTEROL IN Inhale into the lungs.    [provider]  ?benzonatate (TESSALON) 100 MG capsule Take 1 capsule (100 mg total) by mouth every 8 (eight) hours. 02/26/21   Wieters, Hallie C, PA-C  ?cetirizine (ZYRTEC) 10 MG tablet Take 10 mg by mouth daily.    [provider]  ?Cetirizine HCl 10 MG CAPS Take 1 capsule (10 mg total) by mouth daily for 10 days. 02/26/21 03/08/21  Wieters, Hallie C, PA-C  ?fluticasone  (FLONASE) 50 MCG/ACT nasal spray Place 1 spray into both nostrils daily for 7 days. 10/10/20 10/17/20  Orvil Feil, PA-C  ? ? ?Family History ?Family History  ?Problem Relation Age of Onset  ? Healthy Mother   ? Healthy Father   ? ? ?Social History ?Social History  ? ?Tobacco Use  ? Smoking status: Never  ? Smokeless tobacco: Never  ?Vaping Use  ? Vaping Use: Never used  ?Substance Use Topics  ? Alcohol use: Not Currently  ? Drug use: No  ? ? ? ?Allergies   ?Patient has no known allergies. ? ? ?Review of Systems ?Review of Systems  ?Constitutional: Negative.   ?HENT:  Positive for congestion, postnasal drip, sore throat and trouble swallowing. Negative for drooling, ear discharge, ear pain, sinus pressure and sinus pain.   ?Eyes: Negative.   ?Respiratory:  Positive for cough. Negative for shortness of breath.   ?Cardiovascular: Negative.   ?Gastrointestinal: Negative.  Negative for abdominal pain.  ?Skin: Negative.   ?Neurological:  Negative for headaches.  ?Psychiatric/Behavioral: Negative.    ? ? ?Physical Exam ?Triage Vital Signs ?ED Triage Vitals  ?Enc Vitals Group  ?   BP 02/17/22 1210 (!) 152/97  ?   Pulse Rate 02/17/22 1210 (!) 59  ?   Resp 02/17/22 1210 14  ?   Temp 02/17/22  1210 97.8 ?F (36.6 ?C)  ?   Temp Source 02/17/22 1210 Oral  ?   SpO2 02/17/22 1210 98 %  ?   Weight --   ?   Height --   ?   Head Circumference --   ?   Peak Flow --   ?   Pain Score 02/17/22 1209 10  ?   Pain Loc --   ?   Pain Edu? --   ?   Excl. in [place]? --   ? ?No data found. ? ?Updated Vital Signs ?BP (!) 152/97   Pulse (!) 59   Temp 97.8 ?F (36.6 ?C) (Oral)   Resp 14   SpO2 98%  ? ?Visual Acuity ?Right Eye Distance:   ?Left Eye Distance:   ?Bilateral Distance:   ? ?Right Eye Near:   ?Left Eye Near:    ?Bilateral Near:    ? ?Physical Exam ?Constitutional:   ?   General: He is not in acute distress. ?   Appearance: He is well-developed.  ?HENT:  ?   Head: Normocephalic and atraumatic.  ?   Right Ear: Tympanic membrane normal.  ?    Left Ear: Tympanic membrane normal.  ?   Nose: Congestion present. No rhinorrhea.  ?   Mouth/Throat:  ?   Mouth: Mucous membranes are moist.  ?   Pharynx: Pharyngeal swelling and posterior oropharyngeal erythema present.  ?   Tonsils: No tonsillar exudate. 1+ on the right. 1+ on the left.  ?Eyes:  ?   Conjunctiva/sclera: Conjunctivae normal.  ?   Pupils: Pupils are equal, round, and reactive to light.  ?Cardiovascular:  ?   Rate and Rhythm: Normal rate and regular rhythm.  ?Pulmonary:  ?   Effort: Pulmonary effort is normal.  ?   Breath sounds: Normal breath sounds.  ?Abdominal:  ?   General: Bowel sounds are normal.  ?   Palpations: Abdomen is soft.  ?Musculoskeletal:  ?   Cervical back: Normal range of motion and neck supple.  ?Lymphadenopathy:  ?   Cervical: No cervical adenopathy.  ?Skin: ?   General: Skin is warm and dry.  ?Neurological:  ?   Mental Status: He is alert and oriented to person, place, and time.  ?Psychiatric:     ?   Mood and Affect: Mood normal.     ?   Behavior: Behavior normal.  ? ? ? ?UC Treatments / Results  ?Labs ?(all labs ordered are listed, but only abnormal results are displayed) ?Labs Reviewed  ?CULTURE, GROUP A STREP [facility])  ?POCT RAPID STREP A, ED / UC  ? ? ?EKG ? ? ?Radiology ?No results found. ? ?Procedures ?Procedures (including critical care time) ? ?Medications Ordered in UC ?Medications - No data to display ? ?Initial Impression / Assessment and Plan / UC Course  ?I have reviewed the triage vital signs and the nursing notes. ? ?Pertinent labs & imaging results that were available during my care of the patient were reviewed by me and considered in my medical decision making (see chart for details). ? ?Patient presents with sore throat for the past 2 days.  Rapid strep test was negative today.  Will send for throat culture.  Patient does not present with fever, exudates, cervical adenopathy.  In the interim, will provide symptomatic treatment for the patient to include ibuprofen  800 mg every 8 hours for pain and fever, and viscous lidocaine for moderate throat pain.  Patient encouraged to eat foods that  are soft such as soups, broths, or mashed potatoes.  Also recommend either cool or hot liquids, which ever is most soothing for the patient.  Cannot rule out throat irritation from patient's postnasal drainage, so I will also provide treatment for his allergy symptoms.  Encouraged to perform warm salt water gargles 3-4 times daily as needed.  Patient may follow-up if symptoms worsen or do not improve. ?Final Clinical Impressions(s) / UC Diagnoses  ? ?Final diagnoses:  ?None  ? ?Discharge Instructions   ?None ?  ? ?ED Prescriptions   ?None ?  ? ?PDMP not reviewed this encounter. ?  ?Abran Cantor, NP ?02/17/22 1243 ? ?

## 2022-02-17 NOTE — ED Triage Notes (Signed)
C/O sore throat onset 2 days ago without fever. States feels like past strep throat. Denies any runny nose or nasal congestion or cough. ?

## 2022-02-20 LAB — CULTURE, GROUP A STREP (THRC)

## 2022-04-16 ENCOUNTER — Ambulatory Visit (INDEPENDENT_AMBULATORY_CARE_PROVIDER_SITE_OTHER): Payer: Self-pay

## 2022-04-16 ENCOUNTER — Other Ambulatory Visit: Payer: Self-pay | Admitting: Nurse Practitioner

## 2022-04-16 DIAGNOSIS — R52 Pain, unspecified: Secondary | ICD-10-CM

## 2022-04-16 DIAGNOSIS — M25572 Pain in left ankle and joints of left foot: Secondary | ICD-10-CM

## 2022-04-16 DIAGNOSIS — M79672 Pain in left foot: Secondary | ICD-10-CM

## 2022-05-14 DIAGNOSIS — S93409A Sprain of unspecified ligament of unspecified ankle, initial encounter: Secondary | ICD-10-CM | POA: Insufficient documentation

## 2022-05-14 DIAGNOSIS — M25572 Pain in left ankle and joints of left foot: Secondary | ICD-10-CM | POA: Insufficient documentation

## 2022-09-25 IMAGING — DX DG ANKLE COMPLETE 3+V*L*
3 series · 3 of 3 positions shown · non-contrast
Comparison: None

CLINICAL DATA: Twisted LEFT foot/ankle stepping off of a work truck
today, lateral pain, initial encounter

EXAM:
LEFT ANKLE COMPLETE - 3+ VIEW

[ankle ap]
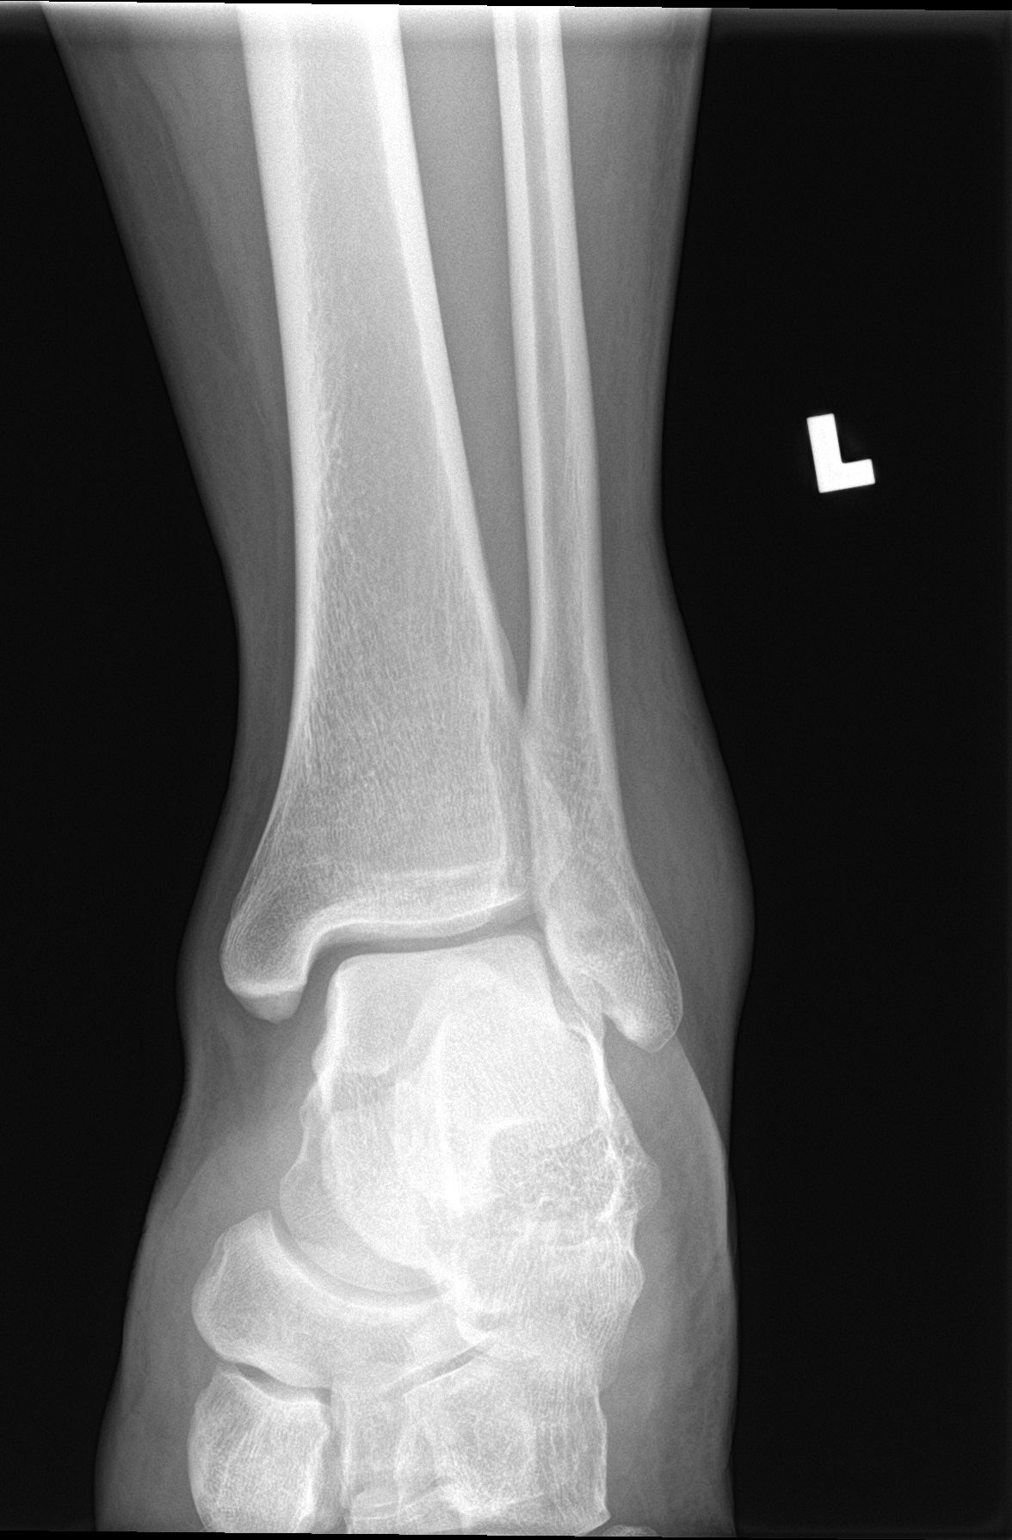

[ankle obl]
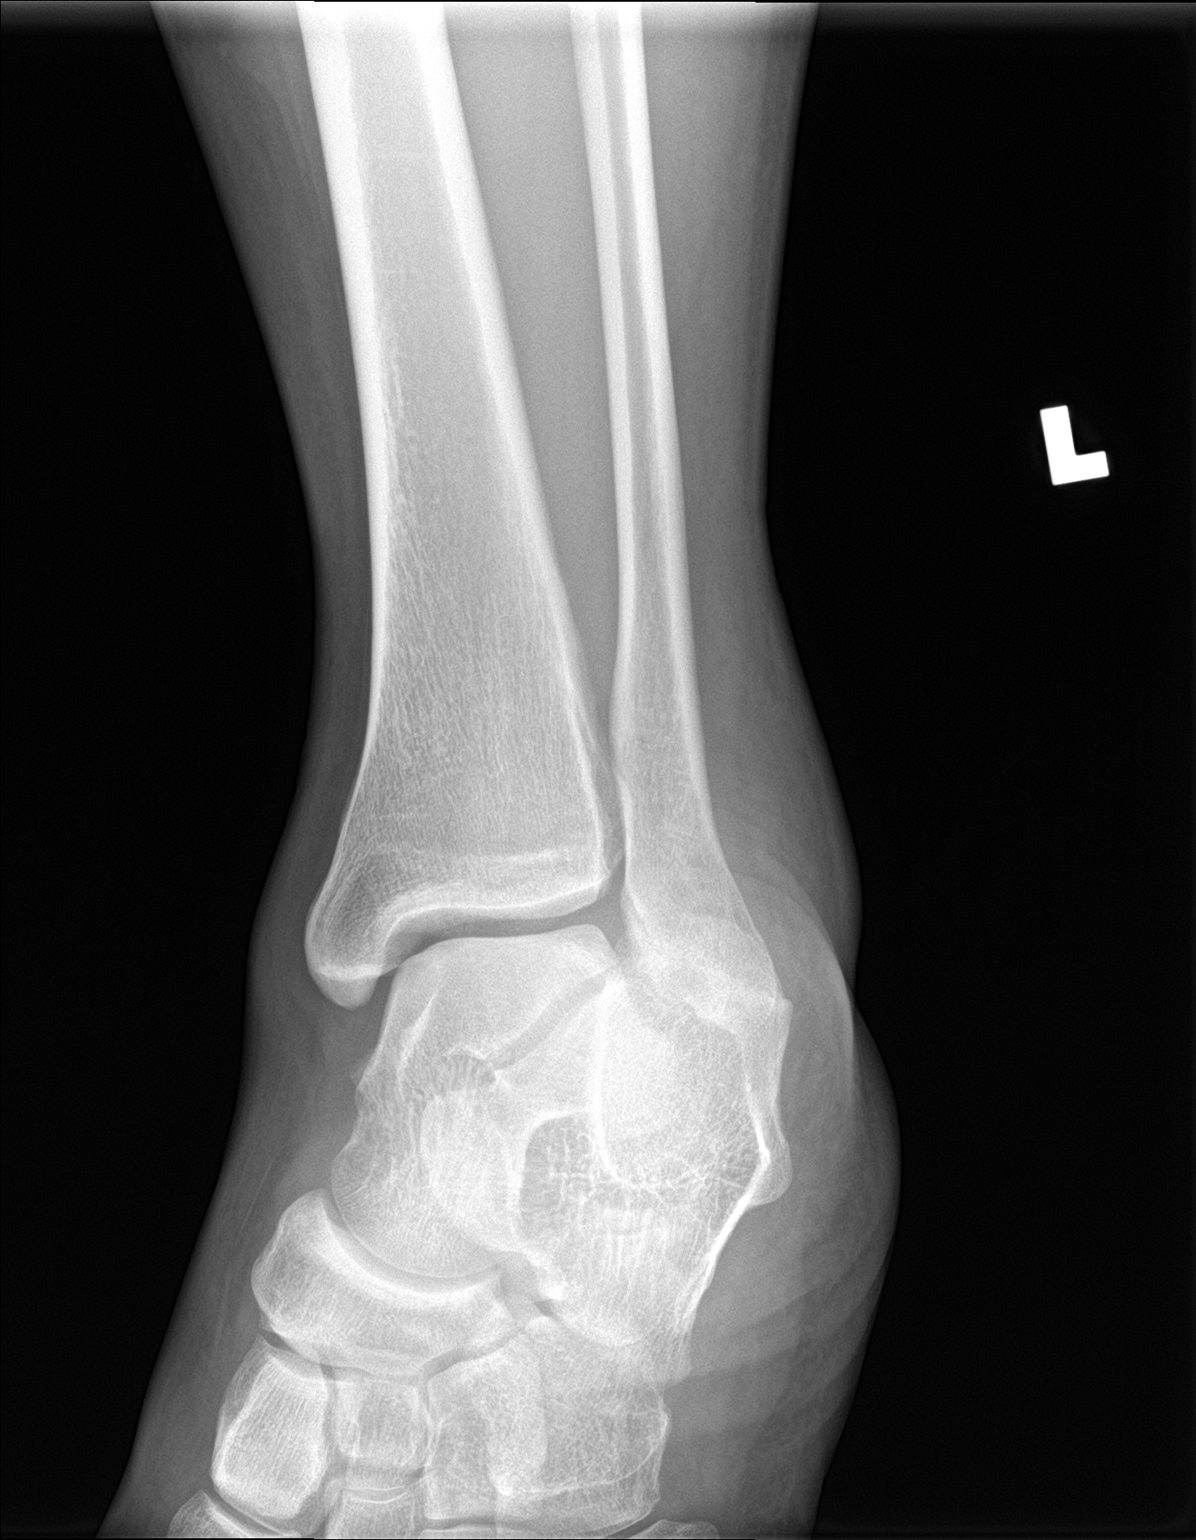

[ankle lat]
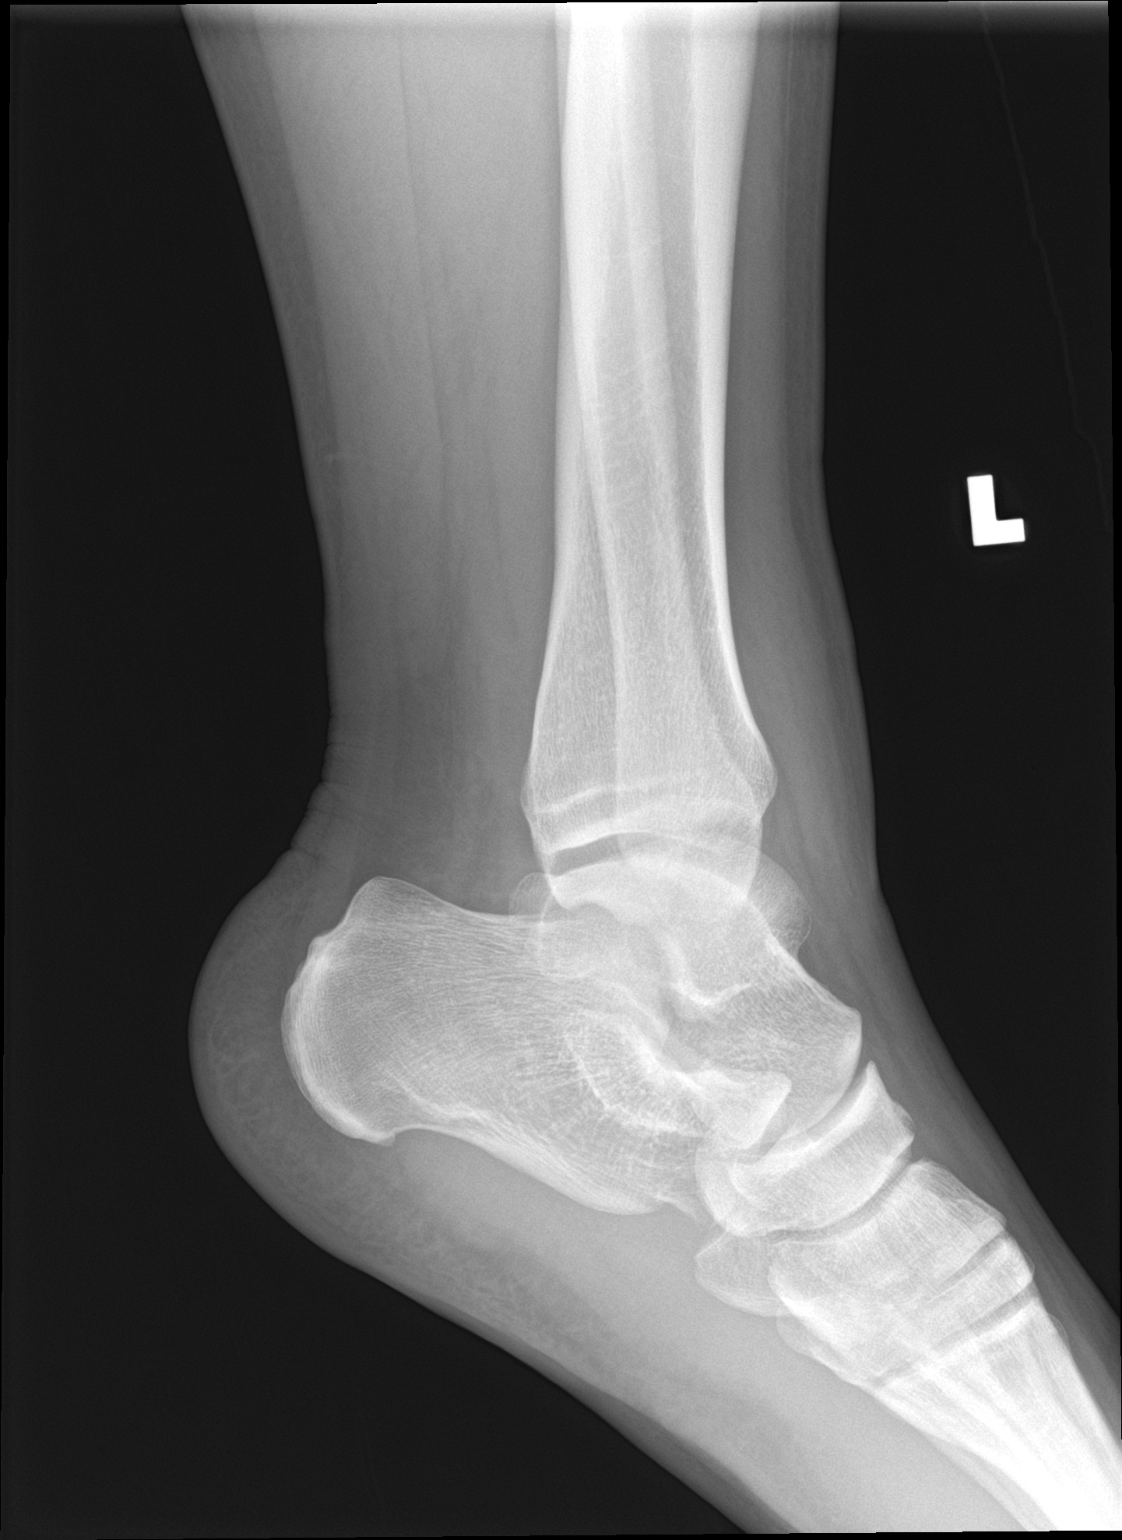

[3 of 3 positions shown; findings below may reference images not displayed]

FINDINGS: Lateral and anterior soft tissue swelling.

Osseous mineralization normal.

Joint spaces preserved.

No acute fracture, dislocation, or bone destruction.
IMPRESSION: No acute osseous abnormalities.

## 2024-03-27 ENCOUNTER — Ambulatory Visit: Admission: EM | Admit: 2024-03-27 | Discharge: 2024-03-27 | Disposition: A | Discharge status: Home / Self Care

## 2024-03-27 DIAGNOSIS — R238 Other skin changes: Secondary | ICD-10-CM

## 2024-03-27 NOTE — Discharge Instructions (Addendum)
 You were seen today for evaluation of a small skin papule. Based on your exam, it does not appear concerning and does not require any treatment or intervention at this time. It is likely a benign skin change and can be monitored at home. Watch for any changes such as rapid growth, changes in color or shape, pain, bleeding, or signs of infection like redness, warmth, or drainage. If any of these changes occur, or if you have new concerns, please follow up with your healthcare provider. Otherwise, no specific treatment is needed.

## 2024-03-27 NOTE — ED Provider Notes (Signed)
 [facility]    CSN: [id number] Arrival date & time: 03/27/24  1038      History   Chief Complaint Chief Complaint  Patient presents with   Rash    HPI Cody Roy is a 23 y.o. male.   Subjective:   Cody Roy is a 23 year old male presenting for evaluation of a rash. He reports noticing three raised areas on the anterior aspect of his neck five days ago. The areas have not changed in size and are not associated with any discomfort. He denies pain, itching, redness, or swelling. He also denies any other symptoms or new exposures. He has not applied or taken any treatments for the rash.  The following portions of the patient's history were reviewed and updated as appropriate: allergies, current medications, past family history, past medical history, past social history, past surgical history, and problem list.         Past Medical History:  Diagnosis Date   Allergy    Strep pharyngitis     Patient Active Problem List   Diagnosis Date Noted   Sprain of lateral ligament of ankle joint 05/14/2022   Pain in left ankle and joints of left foot 05/14/2022    Past Surgical History:  Procedure Laterality Date   NO PAST SURGERIES         Home Medications    Prior to Admission medications   Medication Sig Start Date End Date Taking? Authorizing Provider  fexofenadine (ALLEGRA) 60 MG tablet Take 60 mg by mouth daily.  10/10/20  [provider]  ALBUTEROL IN Inhale into the lungs.    [provider]  amoxicillin (AMOXIL) 500 MG capsule Take 500 mg by mouth 4 (four) times daily.    [provider]  benzonatate (TESSALON) 100 MG capsule Take 1 capsule (100 mg total) by mouth every 8 (eight) hours. 02/26/21   Wieters, Hallie C, PA-C  BROMPHENIRAMINE-PSEUDOEPH PO as directed.    [provider]  cetirizine (ZYRTEC) 10 MG tablet Take 10 mg by mouth daily.    [provider]  Cetirizine HCl 10 MG CAPS Take 1 capsule  (10 mg total) by mouth daily for 10 days. 02/26/21 03/08/21  Wieters, Hallie C, PA-C  chlorhexidine (PERIDEX) 0.12 % solution Use as directed 15 mLs in the mouth or throat as directed.    [provider]  fluticasone (FLONASE) 50 MCG/ACT nasal spray Place 1 spray into both nostrils daily for 7 days. 10/10/20 10/17/20  Woods, Jaclyn M, PA-C  guaiFENesin (MUCINEX PO) Take by mouth.    [provider]  ibuprofen (ADVIL) 800 MG tablet Take 800 mg by mouth every 8 (eight) hours as needed.    [provider]  lidocaine (XYLOCAINE) 2 % solution Use as directed 15 mLs in the mouth or throat every 6 (six) hours as needed.    [provider]    Family History Family History  Problem Relation Age of Onset   Healthy Mother    Healthy Father     Social History Social History   Tobacco Use   Smoking status: Never   Smokeless tobacco: Never  Vaping Use   Vaping status: Never Used  Substance Use Topics   Alcohol use: Not Currently   Drug use: No     Allergies   Patient has no known allergies.   Review of Systems Review of Systems  Musculoskeletal:  Negative for neck pain and neck stiffness.  Skin:  Positive for  rash.  All other systems reviewed and are negative.    Physical Exam Triage Vital Signs ED Triage Vitals  Encounter Vitals Group     BP 03/27/24 1101 133/88     Systolic BP Percentile --      Diastolic BP Percentile --      Pulse Rate 03/27/24 1101 81     Resp 03/27/24 1101 16     Temp 03/27/24 1101 98.2 F (36.8 C)     Temp Source 03/27/24 1101 Oral     SpO2 03/27/24 1101 97 %     Weight --      Height --      Head Circumference --      Peak Flow --      Pain Score 03/27/24 1059 0     Pain Loc --      Pain Education --      Exclude from Growth Chart --    No data found.  Updated Vital Signs BP 133/88 (BP Location: Left Arm)   Pulse 81   Temp 98.2 F (36.8 C) (Oral)   Resp 16   SpO2 97%   Visual Acuity Right Eye  Distance:   Left Eye Distance:   Bilateral Distance:    Right Eye Near:   Left Eye Near:    Bilateral Near:     Physical Exam Vitals reviewed.  Constitutional:      Appearance: Normal appearance.  HENT:     Head: Normocephalic.     Nose: Nose normal.     Mouth/Throat:     Mouth: Mucous membranes are moist.  Cardiovascular:     Rate and Rhythm: Normal rate.  Pulmonary:     Effort: Pulmonary effort is normal.  Musculoskeletal:        General: Normal range of motion.     Cervical back: Normal range of motion and neck supple.  Lymphadenopathy:     Cervical: No cervical adenopathy.  Skin:    General: Skin is warm and dry.     Findings: Rash present. Rash is papular.     Comments: Three papules noted on the anterior neck without swelling, surrounding erythema, or underlying fluctuance. No signs of infection or irritation observed. (See image below)  Neurological:     General: No focal deficit present.     Mental Status: He is alert and oriented to person, place, and time.  Psychiatric:        Mood and Affect: Mood normal.        Behavior: Behavior normal.      UC Treatments / Results  Labs (all labs ordered are listed, but only abnormal results are displayed) Labs Reviewed - No data to display  EKG   Radiology No results found.  Procedures Procedures (including critical care time)  Medications Ordered in UC Medications - No data to display  Initial Impression / Assessment and Plan / UC Course  I have reviewed the triage vital signs and the nursing notes.  Pertinent labs & imaging results that were available during my care of the patient were reviewed by me and considered in my medical decision making (see chart for details).    23 yo male presenting for evaluation for three raised areas on the anterior aspect of his neck five days ago. They have not changed in sized and without any pain, itching, redness or swelling. Papules appear pimple-like with no  associated swelling, surrounding erythema, or underlying fluctuance. No signs of infection or irritation noted  on exam. The appearance is not concerning and does not require treatment or intervention at this time. The lesions are likely benign skin changes and can be monitored at home. No specific intervention is warranted. Patient advised to monitor closely for any changes.  Today's evaluation has revealed no signs of a dangerous process. Discussed diagnosis with patient and/or guardian. Patient and/or guardian aware of their diagnosis, possible red flag symptoms to watch out for and need for close follow up. Patient and/or guardian understands verbal and written discharge instructions. Patient and/or guardian comfortable with plan and disposition.  Patient and/or guardian has a clear mental status at this time, good insight into illness (after discussion and teaching) and has clear judgment to make decisions regarding their care  Documentation was completed with the aid of voice recognition software. Transcription may contain typographical errors. Final Clinical Impressions(s) / UC Diagnoses   Final diagnoses:  Papule of skin     Discharge Instructions      You were seen today for evaluation of a small skin papule. Based on your exam, it does not appear concerning and does not require any treatment or intervention at this time. It is likely a benign skin change and can be monitored at home. Watch for any changes such as rapid growth, changes in color or shape, pain, bleeding, or signs of infection like redness, warmth, or drainage. If any of these changes occur, or if you have new concerns, please follow up with your healthcare provider. Otherwise, no specific treatment is needed.     ED Prescriptions   None    PDMP not reviewed this encounter.   Maryruth Sol, [place] 03/27/24 1343

## 2024-03-27 NOTE — ED Triage Notes (Signed)
 Pt states rash to his neck for one week. 3 red raised areas noted.

## 2024-09-29 ENCOUNTER — Ambulatory Visit
Admission: EM | Admit: 2024-09-29 | Discharge: 2024-09-29 | Disposition: A | Discharge status: Home / Self Care | Attending: Internal Medicine | Admitting: Internal Medicine

## 2024-09-29 DIAGNOSIS — R197 Diarrhea, unspecified: Secondary | ICD-10-CM | POA: Diagnosis present

## 2024-09-29 DIAGNOSIS — Z789 Other specified health status: Secondary | ICD-10-CM | POA: Insufficient documentation

## 2024-09-29 LAB — C DIFFICILE QUICK SCREEN W PCR REFLEX
C Diff antigen: NEGATIVE
C Diff interpretation: NOT DETECTED
C Diff toxin: NEGATIVE

## 2024-09-29 MED ORDER — ONDANSETRON 4 MG PO TBDP: 4.0000 mg | ORAL_TABLET | Freq: Three times a day (TID) | ORAL | 0 refills | Status: AC | PRN

## 2024-09-29 NOTE — ED Provider Notes (Signed)
 [facility]    CSN: [id number] Arrival date & time: 09/29/24  1024      History   Chief Complaint Chief Complaint  Patient presents with   Diarrhea    HPI Cody Roy is a 23 y.o. male.   Cody Roy is a 23 y.o. male presenting for chief complaint of Diarrhea that started 4 days ago. He recently returned to the [place]  after a 3 week vacation in [place] on Friday September 24, 2024. He ate a traditional [place] meal made by his parents when he came home on Friday (5 days ago) and the food had ground pork in it. He's unsure if the food was left out on the counter or if the pork could have been undercooked. He started having watery diarrhea 4 days ago after eating food from his parents the day prior.  The first day of symptoms he had too many episodes of diarrhea to be able to count.  States the diarrhea has slowed down slightly and he is having 4-5 episodes of watery diarrhea per day in the last 24 to 48 hours.  He became nauseous and had 1 episode of nonbloody/nonbilious emesis 2 days ago.  He denies current nausea.  Denies abdominal pain, dizziness, near-syncope/syncope, fever/chills, sick contacts with similar symptoms, low back pain, and urinary symptoms.  He has been taking Imodium and drinking water at home with minimal relief.   Diarrhea   Past Medical History:  Diagnosis Date   Allergy    Strep pharyngitis     Patient Active Problem List   Diagnosis Date Noted   Sprain of lateral ligament of ankle joint 05/14/2022   Pain in left ankle and joints of left foot 05/14/2022    Past Surgical History:  Procedure Laterality Date   NO PAST SURGERIES         Home Medications    Prior to Admission medications   Medication Sig Start Date End Date Taking? Authorizing Provider  ondansetron (ZOFRAN-ODT) 4 MG disintegrating tablet Take 1 tablet (4 mg total) by mouth every 8 (eight) hours as needed for nausea or vomiting. 09/29/24  Yes Enedelia Dorna HERO, FNP   fexofenadine (ALLEGRA) 60 MG tablet Take 60 mg by mouth daily.  10/10/20  [provider]  ALBUTEROL  IN Inhale into the lungs.    [provider]  amoxicillin (AMOXIL) 500 MG capsule Take 500 mg by mouth 4 (four) times daily.    [provider]  benzonatate  (TESSALON ) 100 MG capsule Take 1 capsule (100 mg total) by mouth every 8 (eight) hours. 02/26/21   Wieters, Hallie C, PA-C  BROMPHENIRAMINE-PSEUDOEPH PO as directed.    [provider]  cetirizine  (ZYRTEC ) 10 MG tablet Take 10 mg by mouth daily.    [provider]  Cetirizine  HCl 10 MG CAPS Take 1 capsule (10 mg total) by mouth daily for 10 days. 02/26/21 03/08/21  Wieters, Hallie C, PA-C  chlorhexidine (PERIDEX) 0.12 % solution Use as directed 15 mLs in the mouth or throat as directed.    [provider]  fluticasone  (FLONASE ) 50 MCG/ACT nasal spray Place 1 spray into both nostrils daily for 7 days. 10/10/20 10/17/20  Woods, Jaclyn M, PA-C  guaiFENesin (MUCINEX PO) Take by mouth.    [provider]  ibuprofen  (ADVIL ) 800 MG tablet Take 800 mg by mouth every 8 (eight) hours as needed.    [provider]  lidocaine  (XYLOCAINE ) 2 % solution Use as directed 15 mLs in  the mouth or throat every 6 (six) hours as needed.    [provider]    Family History Family History  Problem Relation Age of Onset   Healthy Mother    Healthy Father     Social History Social History   Tobacco Use   Smoking status: Never   Smokeless tobacco: Never  Vaping Use   Vaping status: Never Used  Substance Use Topics   Alcohol use: Not Currently   Drug use: No     Allergies   Patient has no known allergies.   Review of Systems Review of Systems  Gastrointestinal:  Positive for diarrhea.  Per HPI   Physical Exam Triage Vital Signs ED Triage Vitals  Encounter Vitals Group     BP 09/29/24 1039 123/83     Girls Systolic BP Percentile --      Girls Diastolic BP  Percentile --      Boys Systolic BP Percentile --      Boys Diastolic BP Percentile --      Pulse Rate 09/29/24 1039 98     Resp 09/29/24 1039 16     Temp 09/29/24 1039 97.9 F (36.6 C)     Temp Source 09/29/24 1039 Oral     SpO2 09/29/24 1039 98 %     Weight --      Height --      Head Circumference --      Peak Flow --      Pain Score 09/29/24 1038 0     Pain Loc --      Pain Education --      Exclude from Growth Chart --    No data found.  Updated Vital Signs BP 123/83 (BP Location: Left Arm)   Pulse 98   Temp 97.9 F (36.6 C) (Oral)   Resp 16   SpO2 98%   Visual Acuity Right Eye Distance:   Left Eye Distance:   Bilateral Distance:    Right Eye Near:   Left Eye Near:    Bilateral Near:     Physical Exam Vitals and nursing note reviewed.  Constitutional:      Appearance: He is not ill-appearing or toxic-appearing.  HENT:     Head: Normocephalic and atraumatic.     Right Ear: Hearing and external ear normal.     Left Ear: Hearing and external ear normal.     Nose: Nose normal.     Mouth/Throat:     Lips: Pink.  Eyes:     General: Lids are normal. Vision grossly intact. Gaze aligned appropriately.     Extraocular Movements: Extraocular movements intact.     Conjunctiva/sclera: Conjunctivae normal.  Pulmonary:     Effort: Pulmonary effort is normal.  Abdominal:     General: Bowel sounds are normal.     Palpations: Abdomen is soft.     Tenderness: There is no abdominal tenderness. There is no right CVA tenderness, left CVA tenderness or guarding.  Musculoskeletal:     Cervical back: Neck supple.  Skin:    General: Skin is warm and dry.     Capillary Refill: Capillary refill takes less than 2 seconds.     Findings: No rash.  Neurological:     General: No focal deficit present.     Mental Status: He is alert and oriented to person, place, and time. Mental status is at baseline.     Cranial Nerves: No dysarthria or facial asymmetry.  Psychiatric:  Mood and Affect: Mood normal.        Speech: Speech normal.        Behavior: Behavior normal.        Thought Content: Thought content normal.        Judgment: Judgment normal.      UC Treatments / Results  Labs (all labs ordered are listed, but only abnormal results are displayed) Labs Reviewed  GASTROINTESTINAL PANEL BY PCR, STOOL (REPLACES STOOL CULTURE)  C DIFFICILE QUICK SCREEN W PCR REFLEX      EKG   Radiology No results found.  Procedures Procedures (including critical Roy time)  Medications Ordered in UC Medications - No data to display  Initial Impression / Assessment and Plan / UC Course  I have reviewed the triage vital signs and the nursing notes.  Pertinent labs & imaging results that were available during my Roy of the patient were reviewed by me and considered in my medical decision making (see chart for details).   1. Diarrhea, history of international travel Unclear cause of patient's diarrhea.  Viral versus bacterial travelers diarrhea. GI PCR stool study and C diff panel pending. Staff will call if stool studies are abnormal. Patient appears well hydrated, non-tender on palpation over the abdomen.  Continue to push hydration with pedialyte and water.  Continue imodium PRN, Zofran PRN added on today.   Counseled patient on potential for adverse effects with medications prescribed/recommended today, strict ER and return-to-clinic precautions discussed, patient verbalized understanding.    Final Clinical Impressions(s) / UC Diagnoses   Final diagnoses:  Diarrhea, unspecified type  History of international travel     Discharge Instructions      We have sent off your stool to be evaluated in the lab for infections.  Continue use of Imodium as needed.  Please drink plenty of water and purchase Pedialyte and drink this to replenish your electrolytes since you have had so much watery diarrhea.  Please take Zofran every 8 hours as needed for  nausea/vomiting.  Staff will call with the results of your stool study in the next 2 to 3 days once the results come back if results are abnormal/change treatment plan.  If you develop any new or worsening symptoms or if your symptoms do not start to improve, please return here or follow-up with your primary Roy provider. If your symptoms are severe, please go to the emergency room.      ED Prescriptions     Medication Sig Dispense Auth. Provider   ondansetron (ZOFRAN-ODT) 4 MG disintegrating tablet Take 1 tablet (4 mg total) by mouth every 8 (eight) hours as needed for nausea or vomiting. 20 tablet Enedelia Dorna HERO, FNP      PDMP not reviewed this encounter.   Enedelia Dorna HERO, FNP 09/29/24 1134

## 2024-09-29 NOTE — ED Triage Notes (Signed)
 Pt present diarrhea for four days, pt tried otc medication with no relief.  Pt denies any other symptoms

## 2024-09-29 NOTE — Discharge Instructions (Addendum)
 We have sent off your stool to be evaluated in the lab for infections.  Continue use of Imodium as needed.  Please drink plenty of water and purchase Pedialyte and drink this to replenish your electrolytes since you have had so much watery diarrhea.  Please take Zofran every 8 hours as needed for nausea/vomiting.  Staff will call with the results of your stool study in the next 2 to 3 days once the results come back if results are abnormal/change treatment plan.  If you develop any new or worsening symptoms or if your symptoms do not start to improve, please return here or follow-up with your primary care provider. If your symptoms are severe, please go to the emergency room.

## 2024-09-30 ENCOUNTER — Telehealth: Payer: Self-pay | Admitting: Emergency Medicine

## 2024-09-30 LAB — GASTROINTESTINAL PANEL BY PCR, STOOL (REPLACES STOOL CULTURE)
Adenovirus F40/41: NOT DETECTED
Astrovirus: NOT DETECTED
Campylobacter species: NOT DETECTED
Cryptosporidium: NOT DETECTED
Cyclospora cayetanensis: NOT DETECTED
Entamoeba histolytica: NOT DETECTED
Enteroaggregative E coli (EAEC): DETECTED — AB
Enteropathogenic E coli (EPEC): NOT DETECTED
Enterotoxigenic E coli (ETEC): NOT DETECTED
Giardia lamblia: NOT DETECTED
Norovirus GI/GII: NOT DETECTED
Plesimonas shigelloides: NOT DETECTED
Rotavirus A: DETECTED — AB
Salmonella species: NOT DETECTED
Sapovirus (I, II, IV, and V): NOT DETECTED
Shiga like toxin producing E coli (STEC): NOT DETECTED
Shigella/Enteroinvasive E coli (EIEC): NOT DETECTED
Vibrio cholerae: NOT DETECTED
Vibrio species: NOT DETECTED
Yersinia enterocolitica: NOT DETECTED

## 2024-09-30 MED ORDER — CIPROFLOXACIN HCL 750 MG PO TABS: 750.0000 mg | ORAL_TABLET | Freq: Every day | ORAL | 0 refills | Status: AC

## 2024-09-30 NOTE — Telephone Encounter (Signed)
 Cody Roy called to report that GI panel was positive for enteroaggregative E.coli and rotavirus.

## 2024-09-30 NOTE — Telephone Encounter (Signed)
 Though the rotavirus may be the actual cause of his diarrhea symptoms, since he has had so much trouble with so frequent stooling, I think antibiotic treatment for the E. coli is best.  Cipro 750 mg once daily for 3 days is sent to the pharmacy to treat the E. coli.  For the rotavirus there is no specific treatment other than supportive and making sure he is getting in plenty of fluids.

## 2024-10-01 ENCOUNTER — Ambulatory Visit (HOSPITAL_COMMUNITY): Payer: Self-pay

## 2024-10-01 NOTE — Telephone Encounter (Signed)
 TC to pt. Advised of results and meds sent. Pt states symptoms improved shortly after visit. Will continue to hydrate and advance diet as tolerated.
